# Patient Record
Sex: Male | Born: 1955 | Hispanic: Yes | Marital: Single
Health system: Southern US, Community
[De-identification: ages and names within clinical notes are randomized; demographics above are authoritative.]

---

## 2018-01-06 ENCOUNTER — Emergency Department (HOSPITAL_COMMUNITY): Payer: Self-pay

## 2018-01-06 ENCOUNTER — Other Ambulatory Visit: Payer: Self-pay

## 2018-01-06 ENCOUNTER — Emergency Department (HOSPITAL_COMMUNITY)
Admission: EM | Admit: 2018-01-06 | Discharge: 2018-01-06 | Disposition: A | Discharge status: Home / Self Care | Payer: Self-pay | Attending: Emergency Medicine | Admitting: Emergency Medicine

## 2018-01-06 DIAGNOSIS — R109 Unspecified abdominal pain: Secondary | ICD-10-CM | POA: Insufficient documentation

## 2018-01-06 DIAGNOSIS — R1110 Vomiting, unspecified: Secondary | ICD-10-CM | POA: Insufficient documentation

## 2018-01-06 DIAGNOSIS — Z933 Colostomy status: Secondary | ICD-10-CM | POA: Insufficient documentation

## 2018-01-06 LAB — CBC
HCT: 40 % (ref 39.0–52.0)
Hemoglobin: 13.5 g/dL (ref 13.0–17.0)
MCH: 33.8 pg (ref 26.0–34.0)
MCHC: 33.8 g/dL (ref 30.0–36.0)
MCV: 100 fL (ref 80.0–100.0)
PLATELETS: 98 10*3/uL — AB (ref 150–400)
RBC: 4 MIL/uL — ABNORMAL LOW (ref 4.22–5.81)
RDW: 14.5 % (ref 11.5–15.5)
WBC: 6.8 10*3/uL (ref 4.0–10.5)
nRBC: 0.3 % — ABNORMAL HIGH (ref 0.0–0.2)

## 2018-01-06 LAB — COMPREHENSIVE METABOLIC PANEL
ALBUMIN: 4.1 g/dL (ref 3.5–5.0)
ALT: 40 U/L (ref 0–44)
AST: 62 U/L — ABNORMAL HIGH (ref 15–41)
Alkaline Phosphatase: 63 U/L (ref 38–126)
Anion gap: 17 — ABNORMAL HIGH (ref 5–15)
BUN: 19 mg/dL (ref 8–23)
CO2: 26 mmol/L (ref 22–32)
Calcium: 8.9 mg/dL (ref 8.9–10.3)
Chloride: 92 mmol/L — ABNORMAL LOW (ref 98–111)
Creatinine, Ser: 0.84 mg/dL (ref 0.61–1.24)
GFR calc Af Amer: >60 mL/min (ref >60)
GFR calc non Af Amer: >60 mL/min (ref >60)
Glucose, Bld: 84 mg/dL (ref 70–99)
Potassium: 3.5 mmol/L (ref 3.5–5.1)
Sodium: 135 mmol/L (ref 135–145)
Total Bilirubin: 2.9 mg/dL — ABNORMAL HIGH (ref 0.3–1.2)
Total Protein: 7.6 g/dL (ref 6.5–8.1)

## 2018-01-06 LAB — ETHANOL: Alcohol, Ethyl (B): <10 mg/dL (ref <10)

## 2018-01-06 MED ORDER — ONDANSETRON 8 MG PO TBDP: 8.0000 mg | ORAL_TABLET | Freq: Three times a day (TID) | ORAL | 0 refills | Status: AC | PRN

## 2018-01-06 MED ORDER — SODIUM CHLORIDE 0.9 % IV BOLUS
1000.0000 mL | Freq: Once | INTRAVENOUS | Status: AC
  Administered 2018-01-06: 1000 mL via INTRAVENOUS

## 2018-01-06 MED ORDER — ACETAMINOPHEN 325 MG PO TABS
650.0000 mg | ORAL_TABLET | Freq: Once | ORAL | Status: AC
  Administered 2018-01-06: 650 mg via ORAL
  Filled 2018-01-06: qty 2

## 2018-01-06 MED ORDER — ONDANSETRON HCL 4 MG/2ML IJ SOLN
4.0000 mg | Freq: Once | INTRAMUSCULAR | Status: AC
  Administered 2018-01-06: 4 mg via INTRAVENOUS
  Filled 2018-01-06: qty 2

## 2018-01-06 NOTE — ED Provider Notes (Signed)
[facility]-EMERGENCY DEPT Provider Note   CSN: [id number] Arrival date & time: 01/06/18  [id number]     History   Chief Complaint Chief Complaint  Patient presents with  . Abdominal Pain   Spanish interpreter utilized  HPI [place] is a 62 y.o. male.  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  He states that he left his urostomy and colostomy bags at a friend's house and he does not remember where in [place] he lives nor does he remember his address.  He does admit to drinking alcohol last night.  He reports intermittent abdominal pain over the past several months.  It waxes and wanes.  He is currently homeless.  He is originally from [place].  He has been staying with a friend.  Brought to the emergency department via EMS requesting new nephrostomy and urostomy bags.   No past medical history on file.  There are no active problems to display for this patient.   ** The histories are not reviewed yet. Please review them in the "History" navigator section and refresh this SmartLink.      Home Medications    Prior to Admission medications   Medication Sig Start Date End Date Taking? Authorizing Provider  ondansetron (ZOFRAN ODT) 8 MG disintegrating tablet Take 1 tablet (8 mg total) by mouth every 8 (eight) hours as needed for nausea or vomiting. 01/06/18   Nathan Bilisampos, [place]aume, MD    Family History No family history on file.  Social History Social History   Tobacco Use  . Smoking status: Not on file  Substance Use Topics  . Alcohol use: Not on file  . Drug use: Not on file     Allergies   Patient has no known allergies.   Review of Systems Review of Systems  All other systems reviewed and are negative.    Physical Exam Updated Vital Signs BP (!) 126/91 (BP Location: Left Arm)   Pulse (!) 106   Temp 99.3 F (37.4 C) (Oral)   Resp 16   Ht 5\' 10"   (1.778 m)   Wt 72.6 kg   SpO2 99%   BMI 22.96 kg/m   Physical Exam Vitals signs and nursing note reviewed.  Constitutional:      Appearance: He is well-developed.  HENT:     Head: Normocephalic and atraumatic.  Neck:     Musculoskeletal: Normal range of motion.  Cardiovascular:     Rate and Rhythm: Normal rate and regular rhythm.     Heart sounds: Normal heart sounds.  Pulmonary:     Effort: Pulmonary effort is normal. No respiratory distress.     Breath sounds: Normal breath sounds.  Abdominal:     General: There is no distension.     Palpations: Abdomen is soft.     Tenderness: There is no abdominal tenderness.     Comments: Colostomy and urostomy without complicating features  Musculoskeletal: Normal range of motion.  Skin:    General: Skin is warm and dry.  Neurological:     Mental Status: He is alert and oriented to person, place, and time.  Psychiatric:        Judgment: Judgment normal.      ED Treatments / Results  Labs (all labs ordered are listed, but only abnormal results are displayed) Labs Reviewed  CBC - Abnormal; Notable for the following components:      Result Value  RBC 4.00 (*)    Platelets 98 (*)    nRBC 0.3 (*)    All other components within normal limits  COMPREHENSIVE METABOLIC PANEL - Abnormal; Notable for the following components:   Chloride 92 (*)    AST 62 (*)    Total Bilirubin 2.9 (*)    Anion gap 17 (*)    All other components within normal limits  ETHANOL    EKG None  Radiology Dg Abd 2 Views  Result Date: 01/06/2018 CLINICAL DATA:  Acute upper abdominal pain. EXAM: ABDOMEN - 2 VIEW COMPARISON:  None. FINDINGS: The bowel gas pattern is normal. There is no evidence of free air. No radio-opaque calculi or other significant radiographic abnormality is seen. IMPRESSION: No evidence of bowel obstruction or ileus. Electronically Signed   By: [place], M.D.   On: 01/06/2018 11:24    Procedures Procedures (including  critical care time)  Medications Ordered in ED Medications  sodium chloride 0.9 % bolus 1,000 mL (0 mLs Intravenous Stopped 01/06/18 1131)  ondansetron (ZOFRAN) injection 4 mg (4 mg Intravenous Given 01/06/18 1137)  acetaminophen (TYLENOL) tablet 650 mg (650 mg Oral Given 01/06/18 1105)     Initial Impression / Assessment and Plan / ED Course  I have reviewed the triage vital signs and the nursing notes.  Pertinent labs & imaging results that were available during my care of the patient were reviewed by me and considered in my medical decision making (see chart for details).     Vital signs are stable.  Work-up in the emergency department is without significant abnormality.  No abdominal tenderness noted.  Patient given information on homeless shelters.  Patient given a new colostomy and urostomy bag.  Discharged home to the emergency department in good condition.  Final Clinical Impressions(s) / ED Diagnoses   Final diagnoses:  Vomiting  Abdominal pain, unspecified abdominal location    ED Discharge Orders         Ordered    ondansetron (ZOFRAN ODT) 8 MG disintegrating tablet  Every 8 hours PRN     01/06/18 1227           Nathan Bilis, MD 01/06/18 1231

## 2018-01-06 NOTE — ED Triage Notes (Signed)
Patient limited english, spanish is primary language. Interpreter utilized.   Patient is currently homeless, from [place]. Patient states he stayed with a friend yesterday but forgot the address. Patients wallet and all identifying information at said address.   Patient has hx of stroke, colon cancer. Has colostomy and urostomy. Bags not in place. Patient reports he left them at same address.   He states he has abdominal pain in bilateral upper quadrants 8/10. Pain started two days ago.   Patient reports he had stroke 8 or less years ago.

## 2018-01-06 NOTE — ED Notes (Signed)
Bed: [id number] Expected date:  Expected time:  Means of arrival:  Comments: Needs colostomy and nephrostomy bag replaced.

## 2020-01-17 IMAGING — CR DG ABDOMEN 2V
2 series · 2 of 2 positions shown · non-contrast
Comparison: None.

CLINICAL DATA: Acute upper abdominal pain.

EXAM:
ABDOMEN - 2 VIEW

[w abdomen upright]
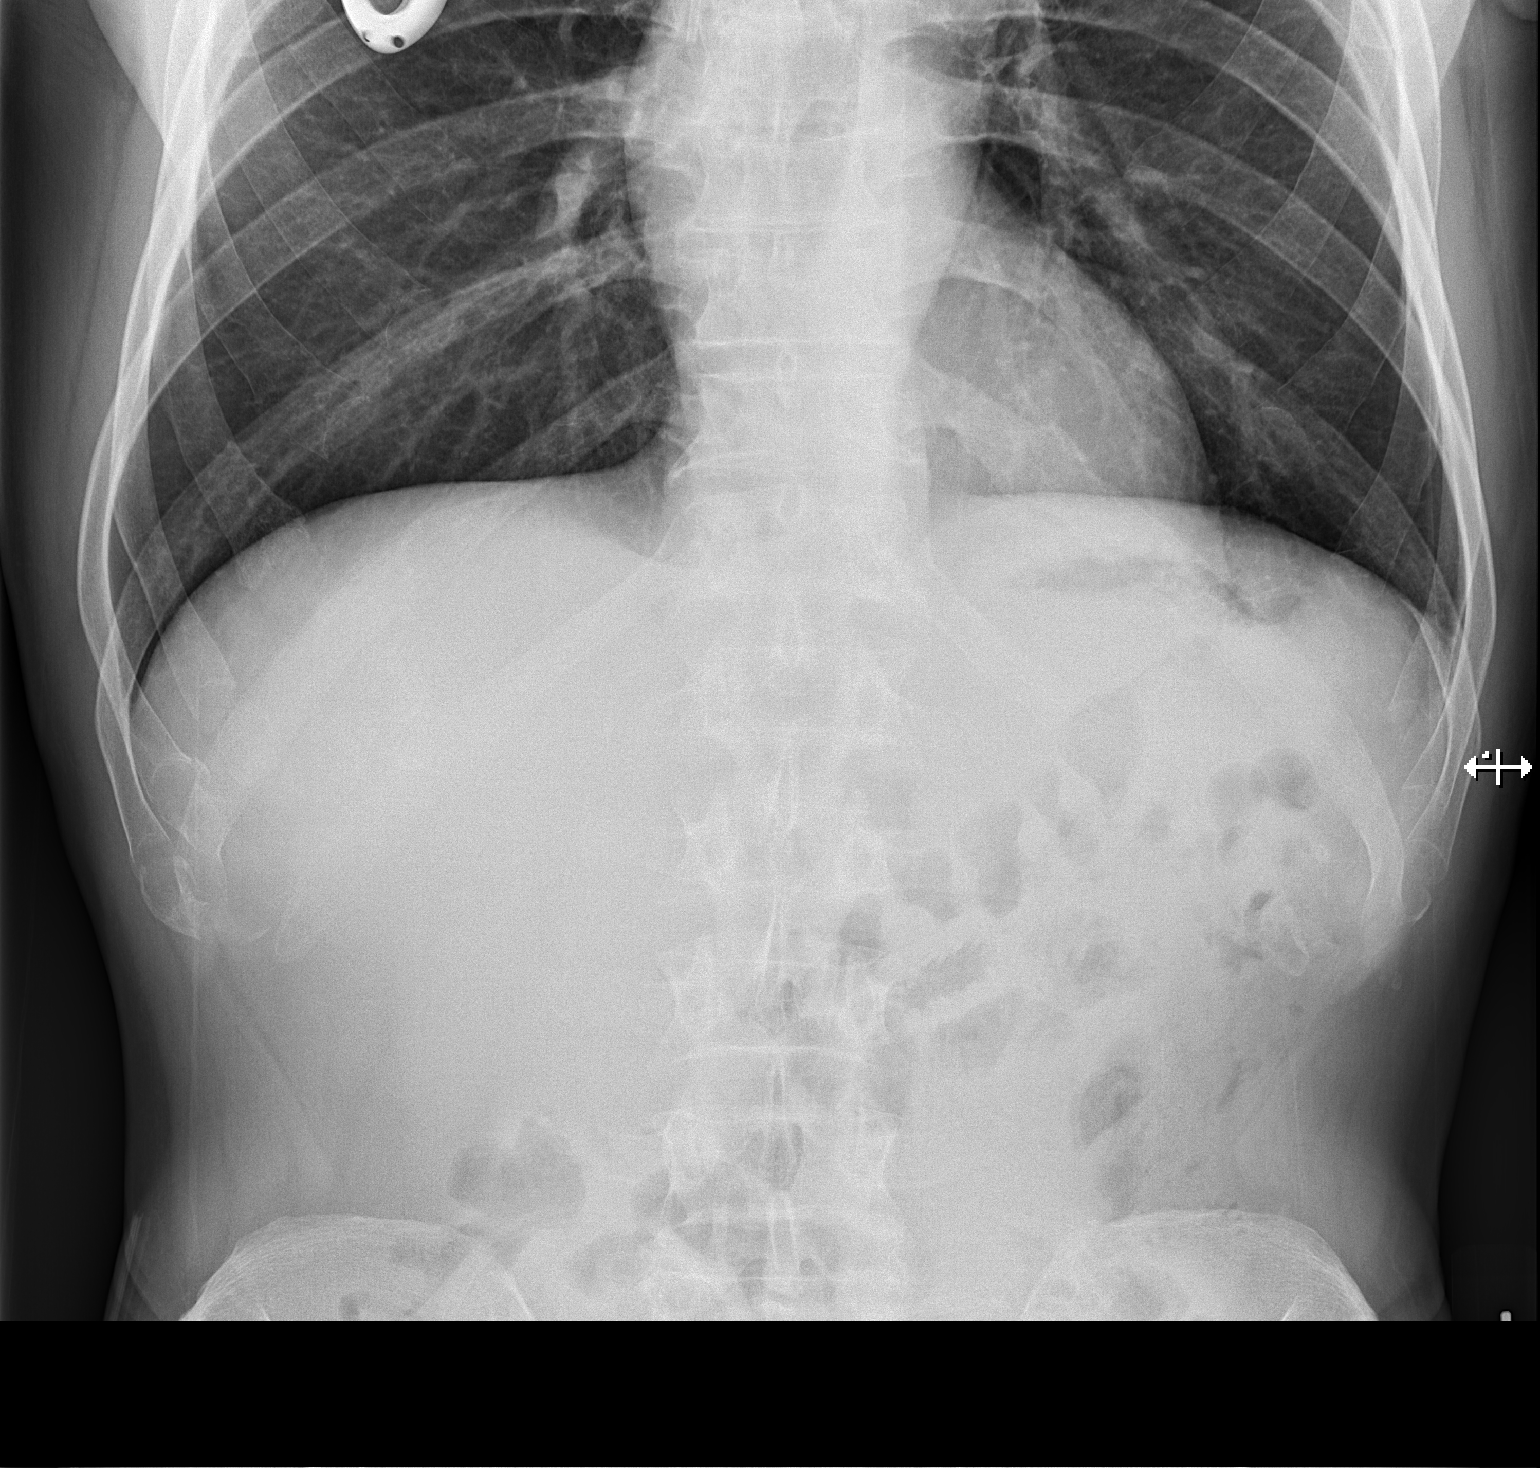

[t abdomen supine]
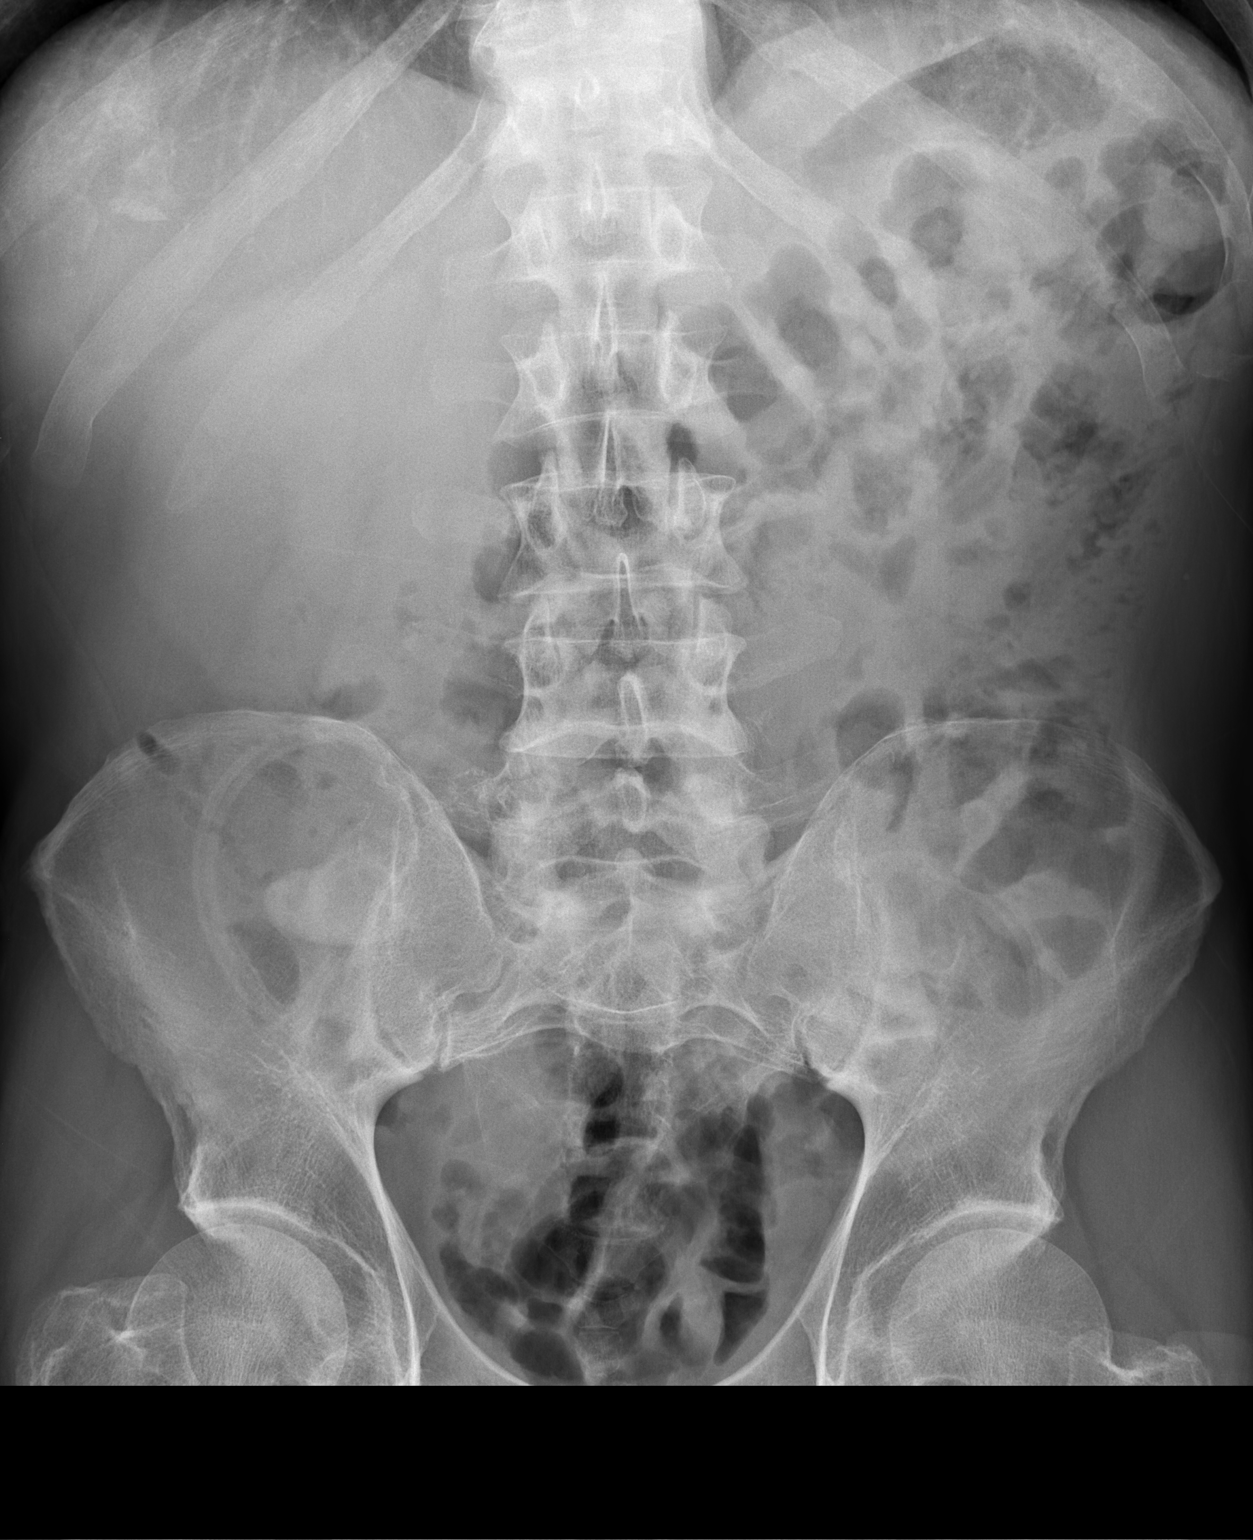

[2 of 2 positions shown; findings below may reference images not displayed]

FINDINGS: The bowel gas pattern is normal. There is no evidence of free air.
No radio-opaque calculi or other significant radiographic
abnormality is seen.
IMPRESSION: No evidence of bowel obstruction or ileus.
